# Patient Record
Sex: Female | Born: 1995 | Race: White | Hispanic: No | Marital: Single | Smoking: Never smoker
Health system: Southern US, Community
[De-identification: ages and names within clinical notes are randomized; demographics above are authoritative.]

---

## 2015-05-22 ENCOUNTER — Emergency Department (HOSPITAL_COMMUNITY)
Admission: EM | Admit: 2015-05-22 | Discharge: 2015-05-22 | Disposition: A | Discharge status: Home / Self Care | Payer: No Typology Code available for payment source | Attending: Emergency Medicine | Admitting: Emergency Medicine

## 2015-05-22 ENCOUNTER — Emergency Department (HOSPITAL_COMMUNITY): Payer: No Typology Code available for payment source

## 2015-05-22 ENCOUNTER — Encounter (HOSPITAL_COMMUNITY): Payer: Self-pay

## 2015-05-22 DIAGNOSIS — W231XXA Caught, crushed, jammed, or pinched between stationary objects, initial encounter: Secondary | ICD-10-CM | POA: Insufficient documentation

## 2015-05-22 DIAGNOSIS — Y9289 Other specified places as the place of occurrence of the external cause: Secondary | ICD-10-CM | POA: Insufficient documentation

## 2015-05-22 DIAGNOSIS — Y9366 Activity, soccer: Secondary | ICD-10-CM | POA: Insufficient documentation

## 2015-05-22 DIAGNOSIS — S6991XA Unspecified injury of right wrist, hand and finger(s), initial encounter: Secondary | ICD-10-CM | POA: Diagnosis present

## 2015-05-22 DIAGNOSIS — S63284A Dislocation of proximal interphalangeal joint of right ring finger, initial encounter: Secondary | ICD-10-CM | POA: Insufficient documentation

## 2015-05-22 DIAGNOSIS — Z880 Allergy status to penicillin: Secondary | ICD-10-CM | POA: Diagnosis not present

## 2015-05-22 DIAGNOSIS — S63259A Unspecified dislocation of unspecified finger, initial encounter: Secondary | ICD-10-CM

## 2015-05-22 DIAGNOSIS — Y998 Other external cause status: Secondary | ICD-10-CM | POA: Insufficient documentation

## 2015-05-22 MED ORDER — OXYCODONE-ACETAMINOPHEN 5-325 MG PO TABS
1.0000 | ORAL_TABLET | Freq: Once | ORAL | Status: AC
  Administered 2015-05-22: 1 via ORAL
  Filled 2015-05-22: qty 1

## 2015-05-22 NOTE — ED Provider Notes (Signed)
CSN: [id number]     Arrival date & time 05/22/15  1607 History  This chart was scribed for non-physician practitioner, Eyvonne Mechanic, PA-C,  working with Eber Hong, MD by Freida Busman, ED Scribe. This patient was seen in room [facility] and the patient's care was started at 4:50 PM.    Chief Complaint  Patient presents with  . Finger Injury   The history is provided by the patient. No language interpreter was used.     HPI Comments:  Christina Love is a 19 y.o. female who presents to the Emergency Department complaining of 6/10 constant right ring finger pain s/p injury today while playing soccer.She is the goalie, states she jammed her finger into the ground and ball as she tried to pick it up. Her atheletic trainers at the game tried to reduce finger but were unsuccessful. Pt is right hand dominant. No alleviating factors or associated symptoms noted.  History reviewed. No pertinent past medical history. History reviewed. No pertinent past surgical history. History reviewed. No pertinent family history. Social History  Substance Use Topics  . Smoking status: Never Smoker   . Smokeless tobacco: None  . Alcohol Use: No   OB History    No data available     Review of Systems  A complete 10 system review of systems was obtained and all systems are negative except as noted in the HPI and PMH.    Allergies  Penicillins  Home Medications   Prior to Admission medications   Not on File   BP 119/73 mmHg  Pulse 76  Temp(Src) 97.9 F (36.6 C) (Oral)  Resp 20  SpO2 100%  LMP 05/08/2015 Physical Exam  Constitutional: She is oriented to person, place, and time. She appears well-developed and well-nourished. No distress.  HENT:  Head: Normocephalic and atraumatic.  Eyes: Conjunctivae are normal.  Cardiovascular: Normal rate.   Pulmonary/Chest: Effort normal.  Abdominal: She exhibits no distension.  Musculoskeletal:  obvious dorsal dislocation of the right PIP sensation  intact  Cap refill less than 3 seconds  Neurological: She is alert and oriented to person, place, and time.  Skin: Skin is warm and dry.  Psychiatric: She has a normal mood and affect.  Nursing note and vitals reviewed.   ED Course  Procedures   REDUCTION NOTE: Patient identification was confirmed and verbal consent was obtained. This procedure was performed by Eyvonne Mechanic, PA-C  at 5:00 PM. Pt denied pain medication prior to procedure.  Reduction performed: Finger dislocation. DNVI  Labs Review Labs Reviewed - No data to display  Imaging Review Dg Finger Ring Right  05/22/2015   CLINICAL DATA:  Postreduction  EXAM: RIGHT RING FINGER 2+V  COMPARISON:  Prior film same day  FINDINGS: Three views of the right fourth finger submitted. Postreduction proximal interphalangeal joint in anatomic alignment.  IMPRESSION: Postreduction proximal interphalangeal joint in anatomic alignment.   Electronically Signed   By: Natasha Mead M.D.   On: 05/22/2015 17:16   Dg Finger Ring Right  05/22/2015   CLINICAL DATA:  Soccer injury today with pain and deformity.  EXAM: RIGHT RING FINGER 2+V  COMPARISON:  None.  FINDINGS: There is dorsal dislocation of the middle phalanx at the PIP joint. No visible fracture. No other regional bony abnormality.  IMPRESSION: Dorsal dislocation of the middle phalanx at the PIP joint.   Electronically Signed   By: Paulina Fusi M.D.   On: 05/22/2015 16:37   I have personally reviewed and evaluated these images  and lab results as part of my medical decision-making.   EKG Interpretation None      MDM   Final diagnoses:  Finger dislocation, initial encounter    Labs:   Imaging: DG Right Ring Finger- Dorsal dislocation of the middle phalanx at the PIP joint; DG Right Ring Finger #2- Postreduction proximal interphalangeal joint in anatomic alignment  Consults:   Therapeutics:   Discharge Meds:  Assessment/Plan: Patient presents with obvious dorsal dislocation of  the right PIP. Reduction performed in ED, postreduction imaging showed proximal interphalangeal joint in anatomic alignment . Patient is instructed to leave splint on for 3-5 days, take ibuprofen, tylenol and continue to ice. Pt also advised to follow-up with ortho. Patient verbalized understanding and agreement for today's plan had no further questions or concerns at time of discharge.    I personally performed the services described in this documentation, which was scribed in my presence. The recorded information has been reviewed and is accurate.    Eyvonne Mechanic, PA-C 05/22/15 2033  Eber Hong, MD 05/22/15 [phone]

## 2015-05-22 NOTE — Discharge Instructions (Signed)
Dislocation or Subluxation Dislocation of a joint occurs when ends of two or more adjacent bones no longer touch each other. A subluxation is a minor form of a dislocation, in which two or more adjacent bones are no longer properly aligned. The most common joints susceptible to a dislocation are the shoulder, kneecap, and fingers.  SYMPTOMS   Sudden pain at the time of injury.  Noticeable deformity in the area of the joint.  Limited range of motion. CAUSES   Usually a traumatic injury that stretches or tears ligaments that surround a joint and hold the bones together.  Condition present at birth (congenital) in which the joint surfaces are shallow or abnormally formed.  Joint disease such as arthritis or other diseases of ligaments and tissues around a joint. RISK INCREASES WITH:  Repeated injury to a joint.  Previous dislocation of a joint.  Contact sports (football, rugby, hockey, lacrosse) or sports that require repetitive overhead arm motion (throwing, swimming, volleyball).  Rheumatoid arthritis.  Congenital joint condition. PREVENTION  Warm up and stretch properly before activity.  Maintain physical fitness:  Joint flexibility.  Muscle strength and endurance.  Cardiovascular fitness.  Wear proper protective equipment and ensure correct fit.  Learn and use proper technique. PROGNOSIS  This condition is usually curable with prompt treatment. After the dislocation has been put back in place, the joint may require immobilization with a cast, splint, or sling for 2 to 6 weeks, often followed by strength and stretching exercises that may be performed at home or with a therapist. RELATED COMPLICATIONS   Damage to nearby nerves or major blood vessels, causing numbness, coldness, or paleness.  Recurrent injury to the joint.  Arthritis of affected joint.  Fracture of joint. TREATMENT Treatment initially involves realigning the bones (reduction) of the joint.  Reductions should only be performed by someone who is trained in the procedure. After the joint is reduced, medicine and ice should be used to reduce pain and inflammation. The joint may be immobilized to allow for the muscles and ligaments to heal. If a joint is subjected to recurrent dislocations, surgery may be necessary to tighten or replace injured structures. After surgery, stretching and strengthening exercises may be required. These may be performed at home or with a therapist. MEDICATION   Patients may require medicine to help them relax (sedative) or muscle relaxants in order to reduce the joint.  If pain medicine is necessary, nonsteroidal anti-inflammatory medicines, such as aspirin and ibuprofen, or other minor pain relievers, such as acetaminophen, are often recommended.  Do not take pain medicine for 7 days before surgery.  Prescription pain relievers may be necessary. Use only as directed and only as much as you need. SEEK MEDICAL CARE IF:   Symptoms get worse or do not improve despite treatment.  You have difficulty moving a joint after injury.  Any extremity becomes numb, pale, or cool after injury. This is an emergency.  Dislocations or subluxations occur repeatedly. Document Released: 08/20/2005 Document Revised: 11/12/2011 Document Reviewed: 12/02/2008 [facility] Patient Information 2015 [place], [place]. This information is not intended to replace advice given to you by your health care provider. Make sure you discuss any questions you have with your health care provider.   Please use Tylenol or ibuprofen as needed for pain, please follow-up with your orthopedic specialist in 3-5 days for reevaluation. Please return immediately if new worsening signs or symptoms present.

## 2015-05-22 NOTE — ED Notes (Signed)
Patient was playing soccer and injured her right 4th finger.

## 2016-11-13 IMAGING — CR DG FINGER RING 2+V*R*
3 series · 3 of 3 positions shown · non-contrast
Comparison: None.

CLINICAL DATA: Soccer injury today with pain and deformity.

EXAM:
RIGHT RING FINGER 2+V

[x finger pa right]
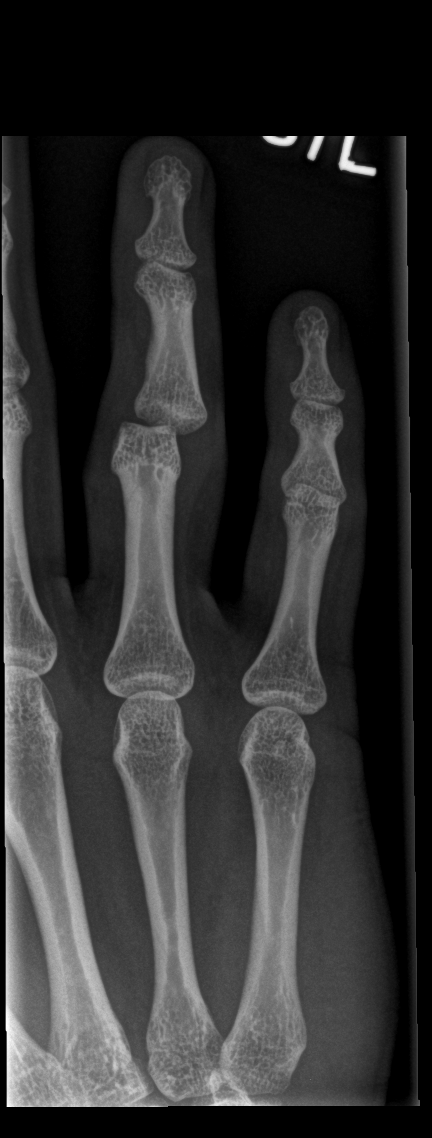

[x finger obl right]
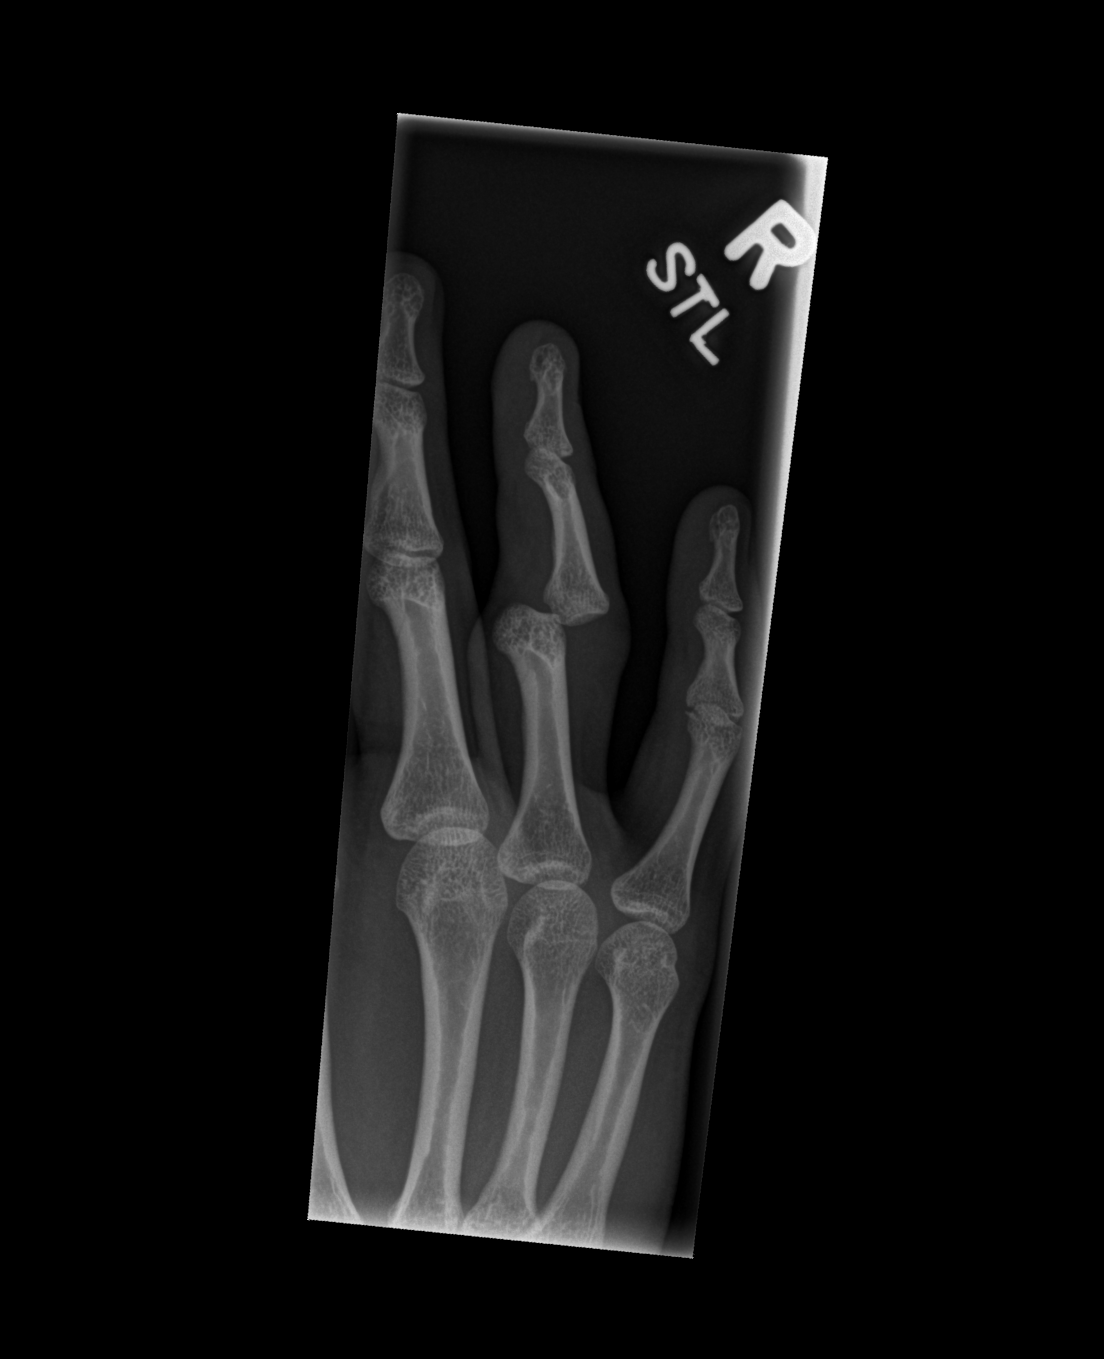

[x finger lat right]
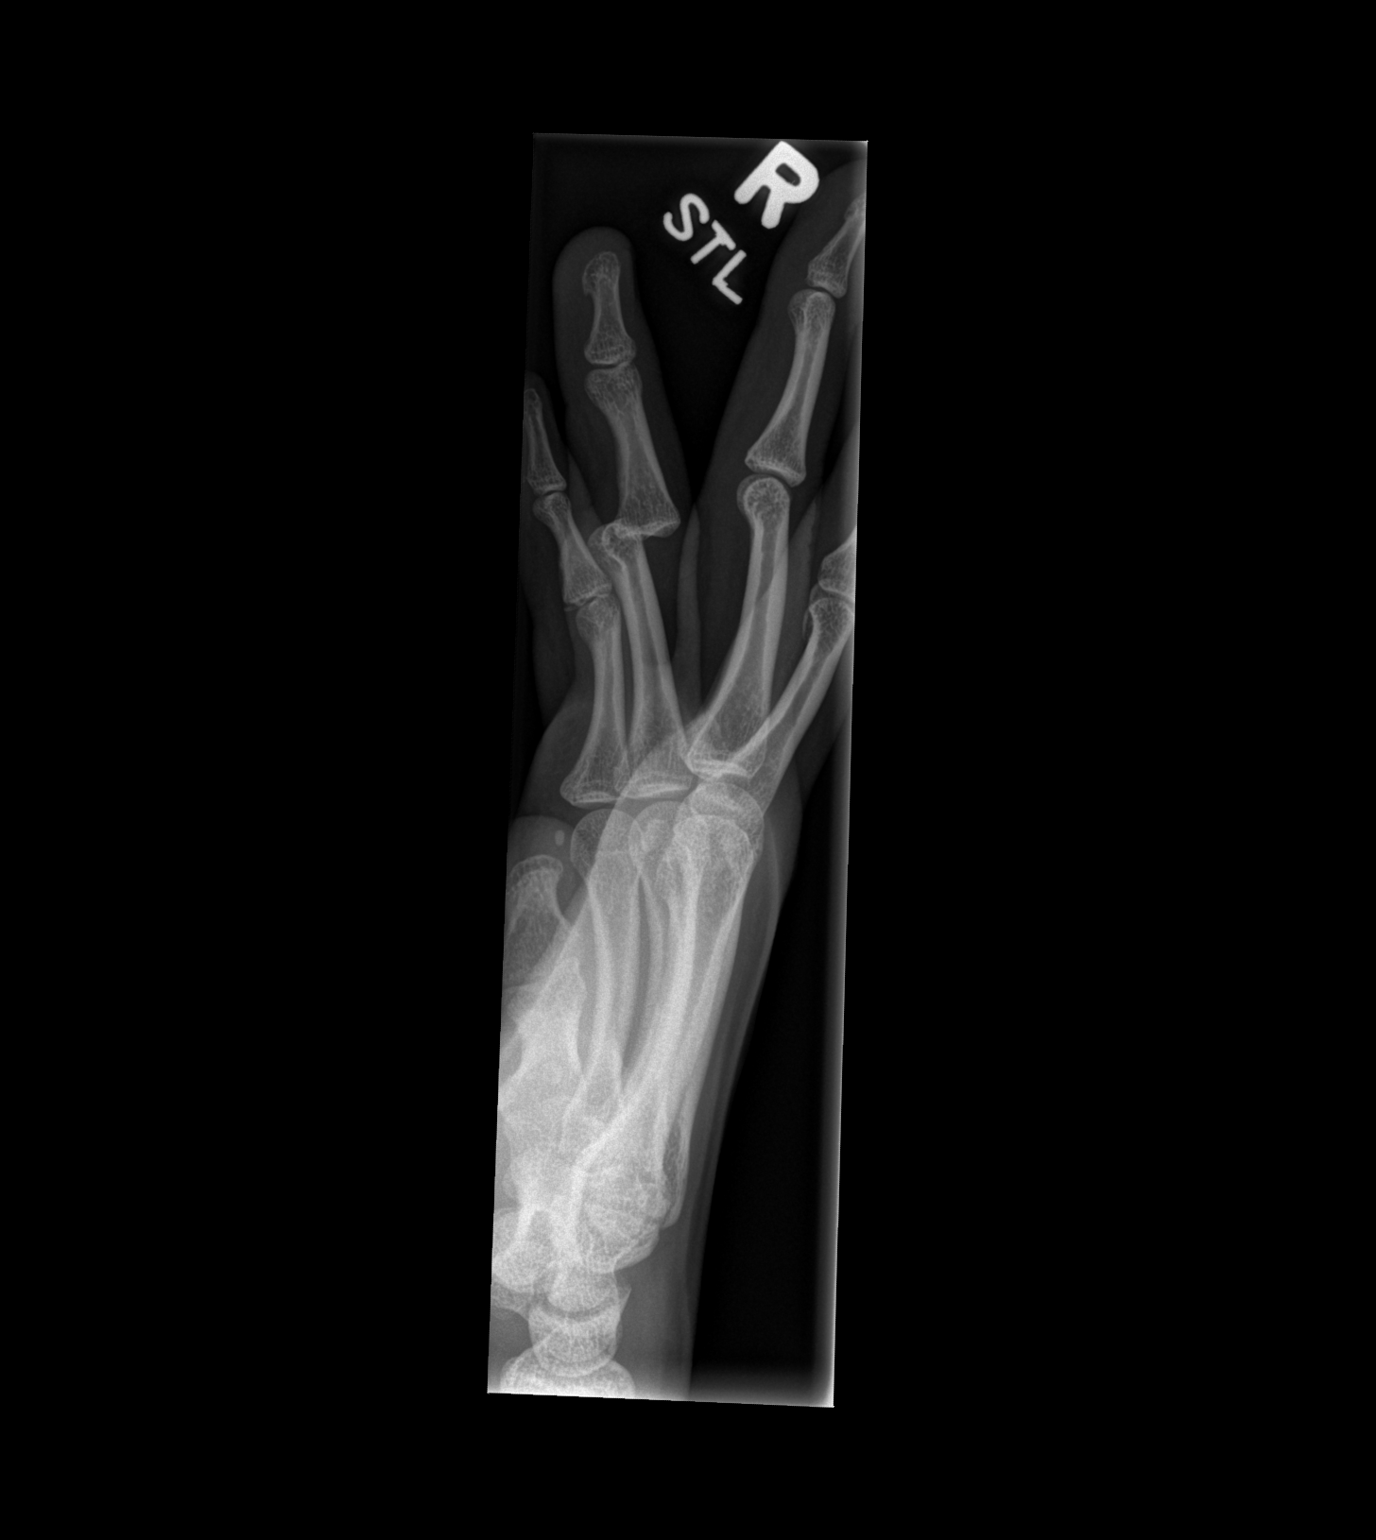

[3 of 3 positions shown; findings below may reference images not displayed]

FINDINGS: There is dorsal dislocation of the middle phalanx at the PIP joint.
No visible fracture. No other regional bony abnormality.
IMPRESSION: Dorsal dislocation of the middle phalanx at the PIP joint.
